# Patient Record
Sex: Female | Born: 1970 | Race: White | Hispanic: No | Marital: Single | Smoking: Never smoker
Health system: Southern US, Community
[De-identification: ages and names within clinical notes are randomized; demographics above are authoritative.]

## PROBLEM LIST (undated history)

## (undated) DIAGNOSIS — Z789 Other specified health status: Secondary | ICD-10-CM

## (undated) HISTORY — PX: WISDOM TOOTH EXTRACTION: SHX21

---

## 1997-09-06 ENCOUNTER — Ambulatory Visit (HOSPITAL_COMMUNITY): Admission: RE | Admit: 1997-09-06 | Discharge: 1997-09-06 | Payer: Self-pay | Admitting: *Deleted

## 1998-10-18 ENCOUNTER — Other Ambulatory Visit: Admission: RE | Admit: 1998-10-18 | Discharge: 1998-10-18 | Payer: Self-pay | Admitting: *Deleted

## 1999-08-27 ENCOUNTER — Inpatient Hospital Stay (HOSPITAL_COMMUNITY): Admission: AD | Admit: 1999-08-27 | Discharge: 1999-08-27 | Payer: Self-pay | Admitting: Obstetrics and Gynecology

## 1999-08-27 ENCOUNTER — Inpatient Hospital Stay (HOSPITAL_COMMUNITY): Admission: AD | Admit: 1999-08-27 | Discharge: 1999-08-30 | Payer: Self-pay | Admitting: Obstetrics and Gynecology

## 1999-10-02 ENCOUNTER — Other Ambulatory Visit: Admission: RE | Admit: 1999-10-02 | Discharge: 1999-10-02 | Payer: Self-pay | Admitting: Obstetrics and Gynecology

## 2000-11-25 ENCOUNTER — Other Ambulatory Visit: Admission: RE | Admit: 2000-11-25 | Discharge: 2000-11-25 | Payer: Self-pay | Admitting: Obstetrics and Gynecology

## 2001-10-20 ENCOUNTER — Other Ambulatory Visit: Admission: RE | Admit: 2001-10-20 | Discharge: 2001-10-20 | Payer: Self-pay | Admitting: Obstetrics and Gynecology

## 2002-05-08 ENCOUNTER — Inpatient Hospital Stay (HOSPITAL_COMMUNITY): Admission: AD | Admit: 2002-05-08 | Discharge: 2002-05-10 | Payer: Self-pay | Admitting: Obstetrics and Gynecology

## 2002-06-11 ENCOUNTER — Other Ambulatory Visit: Admission: RE | Admit: 2002-06-11 | Discharge: 2002-06-11 | Payer: Self-pay | Admitting: Obstetrics and Gynecology

## 2003-08-09 ENCOUNTER — Other Ambulatory Visit: Admission: RE | Admit: 2003-08-09 | Discharge: 2003-08-09 | Payer: Self-pay | Admitting: Obstetrics and Gynecology

## 2004-10-31 ENCOUNTER — Other Ambulatory Visit: Admission: RE | Admit: 2004-10-31 | Discharge: 2004-10-31 | Payer: Self-pay | Admitting: Obstetrics and Gynecology

## 2013-11-30 LAB — OB RESULTS CONSOLE RPR: RPR: NONREACTIVE

## 2013-11-30 LAB — OB RESULTS CONSOLE RUBELLA ANTIBODY, IGM: Rubella: NON-IMMUNE/NOT IMMUNE

## 2013-11-30 LAB — OB RESULTS CONSOLE HEPATITIS B SURFACE ANTIGEN: Hepatitis B Surface Ag: NEGATIVE

## 2013-11-30 LAB — OB RESULTS CONSOLE HIV ANTIBODY (ROUTINE TESTING): HIV: NONREACTIVE

## 2013-12-08 LAB — OB RESULTS CONSOLE GC/CHLAMYDIA
Chlamydia: NEGATIVE
GC PROBE AMP, GENITAL: NEGATIVE

## 2014-02-12 NOTE — L&D Delivery Note (Signed)
Delivery Note  SVD viable female Apgars 8 over 2nd degree ML lac.  Placenta delivered spontaneously intact with 3VC. Repair with 2-0 Chromic with good support and hemostasis noted and R/V exam confirms.  PH art was not done.  [place] cord blood was not done.  Mother and baby were doing well.  EBL 150cc  Candice Campavid Victor Granados, MD

## 2014-06-07 LAB — OB RESULTS CONSOLE GBS: GBS: POSITIVE

## 2014-06-29 ENCOUNTER — Inpatient Hospital Stay (HOSPITAL_COMMUNITY)
Admission: AD | Admit: 2014-06-29 | Discharge: 2014-07-02 | DRG: 775 | Disposition: A | Discharge status: Home / Self Care | Payer: BLUE CROSS/BLUE SHIELD | Source: Ambulatory Visit | Attending: Obstetrics and Gynecology | Admitting: Obstetrics and Gynecology

## 2014-06-29 ENCOUNTER — Encounter (HOSPITAL_COMMUNITY): Payer: Self-pay | Admitting: *Deleted

## 2014-06-29 DIAGNOSIS — O99820 Streptococcus B carrier state complicating pregnancy: Secondary | ICD-10-CM | POA: Diagnosis present

## 2014-06-29 DIAGNOSIS — Z3A39 39 weeks gestation of pregnancy: Secondary | ICD-10-CM | POA: Diagnosis present

## 2014-06-29 DIAGNOSIS — D696 Thrombocytopenia, unspecified: Secondary | ICD-10-CM | POA: Diagnosis present

## 2014-06-29 DIAGNOSIS — O9912 Other diseases of the blood and blood-forming organs and certain disorders involving the immune mechanism complicating childbirth: Principal | ICD-10-CM | POA: Diagnosis present

## 2014-06-29 DIAGNOSIS — O09523 Supervision of elderly multigravida, third trimester: Secondary | ICD-10-CM | POA: Diagnosis present

## 2014-06-29 HISTORY — DX: Other specified health status: Z78.9

## 2014-06-29 LAB — TYPE AND SCREEN
ABO/RH(D): A POS
ANTIBODY SCREEN: NEGATIVE

## 2014-06-29 LAB — CBC
HEMATOCRIT: 33.9 % — AB (ref 36.0–46.0)
Hemoglobin: 11.9 g/dL — ABNORMAL LOW (ref 12.0–15.0)
MCH: 30.2 pg (ref 26.0–34.0)
MCHC: 35.1 g/dL (ref 30.0–36.0)
MCV: 86 fL (ref 78.0–100.0)
PLATELETS: 92 10*3/uL — AB (ref 150–400)
RBC: 3.94 MIL/uL (ref 3.87–5.11)
RDW: 14 % (ref 11.5–15.5)
WBC: 11.1 10*3/uL — ABNORMAL HIGH (ref 4.0–10.5)

## 2014-06-29 LAB — AMNISURE RUPTURE OF MEMBRANE (ROM) NOT AT ARMC: AMNISURE: POSITIVE

## 2014-06-29 MED ORDER — PENICILLIN G POTASSIUM 5000000 UNITS IJ SOLR
2.5000 10*6.[IU] | INTRAMUSCULAR | Status: DC
  Filled 2014-06-29 (×4): qty 2.5

## 2014-06-29 MED ORDER — PENICILLIN G POTASSIUM 5000000 UNITS IJ SOLR
5.0000 10*6.[IU] | Freq: Once | INTRAVENOUS | Status: AC
  Administered 2014-06-29: 5 10*6.[IU] via INTRAVENOUS
  Filled 2014-06-29: qty 5

## 2014-06-29 MED ORDER — BUTORPHANOL TARTRATE 1 MG/ML IJ SOLN: 1.0000 mg | Freq: Once | INTRAMUSCULAR | Status: DC

## 2014-06-29 MED ORDER — LACTATED RINGERS IV SOLN
INTRAVENOUS | Status: DC
  Administered 2014-06-29: 23:00:00 via INTRAVENOUS

## 2014-06-29 MED ORDER — BUTORPHANOL TARTRATE 1 MG/ML IJ SOLN
INTRAMUSCULAR | Status: AC
  Administered 2014-06-29: 1 mg
  Filled 2014-06-29: qty 1

## 2014-06-29 NOTE — MAU Note (Signed)
Pt reports ?leaking fluid since 2030, contractions.

## 2014-06-29 NOTE — Progress Notes (Signed)
Dr Rana SnareLowe notified of pt's arrival and complaints, + amnisure

## 2014-06-29 NOTE — MAU Provider Note (Signed)
Colleen Shaffer is a 44 y.o. [id number] at 291w3d  who presents to MAU today complaining of contractions and LOF. The patient states LOF since 2030 today. She states blood tinge in mucus, but denies heavy vaginal bleeding. She states irregular contractions. She denies complications with the pregnancy.    BP 134/78 mmHg  Pulse 73  Temp(Src) 98.2 F (36.8 C) (Oral)  Resp 16  Ht 5\' 4"  (1.626 m)  Wt 151 lb (68.493 kg)  BMI 25.91 kg/m2  SpO2 100% GENERAL: Well-developed, well-nourished female in no acute distress.  HEAD: Normocephalic, atraumatic.  CHEST: Normal effort of breathing, regular heart rate ABDOMEN: Soft, nontender, nondistended.  PELVIC: Normal external female genitalia. Vagina is pink and rugated.  Normal discharge.  Positive pooling. Gravid uterus.   EXTREMITIES: No cyanosis, clubbing, or edema Dilation: 2.5 Effacement (%): 90 Cervical Position: Middle Station: -3 Presentation: Vertex Exam by:: ginger morris rn  Fern - Negative  Fetal Monitoring:  Baseline: 130 bpm, moderate variability, + accelerations, no decelerations Contractions: q 3-5 minutes  MDM Exam showed pooling, but fern negative. Amnisure ordered.   Results for orders placed or performed during the hospital encounter of 06/29/14 (from the past 24 hour(s))  Amnisure rupture of membrane (rom)     Status: None   Collection Time: 06/29/14 10:20 PM  Result Value Ref Range   Amnisure ROM POSITIVE    A: SIUP at 421w3d SROM  P: Report given to RN to contact MD on call for further instructions  Marny LowensteinJulie N Eythan Jayne, PA-C 06/29/2014 10:37 PM

## 2014-06-30 ENCOUNTER — Encounter (HOSPITAL_COMMUNITY): Payer: Self-pay

## 2014-06-30 DIAGNOSIS — O9912 Other diseases of the blood and blood-forming organs and certain disorders involving the immune mechanism complicating childbirth: Secondary | ICD-10-CM | POA: Diagnosis present

## 2014-06-30 DIAGNOSIS — O09523 Supervision of elderly multigravida, third trimester: Secondary | ICD-10-CM | POA: Diagnosis present

## 2014-06-30 DIAGNOSIS — D696 Thrombocytopenia, unspecified: Secondary | ICD-10-CM | POA: Diagnosis present

## 2014-06-30 DIAGNOSIS — O99820 Streptococcus B carrier state complicating pregnancy: Secondary | ICD-10-CM | POA: Diagnosis present

## 2014-06-30 DIAGNOSIS — Z3A39 39 weeks gestation of pregnancy: Secondary | ICD-10-CM | POA: Diagnosis present

## 2014-06-30 LAB — CBC
HEMATOCRIT: 32.8 % — AB (ref 36.0–46.0)
HEMOGLOBIN: 11.2 g/dL — AB (ref 12.0–15.0)
MCH: 29.8 pg (ref 26.0–34.0)
MCHC: 34.1 g/dL (ref 30.0–36.0)
MCV: 87.2 fL (ref 78.0–100.0)
Platelets: 86 10*3/uL — ABNORMAL LOW (ref 150–400)
RBC: 3.76 MIL/uL — ABNORMAL LOW (ref 3.87–5.11)
RDW: 14 % (ref 11.5–15.5)
WBC: 18.3 10*3/uL — ABNORMAL HIGH (ref 4.0–10.5)

## 2014-06-30 LAB — RPR: RPR Ser Ql: NONREACTIVE

## 2014-06-30 LAB — ABO/RH: ABO/RH(D): A POS

## 2014-06-30 MED ORDER — ACETAMINOPHEN 325 MG PO TABS
650.0000 mg | ORAL_TABLET | ORAL | Status: DC | PRN
  Filled 2014-06-30: qty 2

## 2014-06-30 MED ORDER — WITCH HAZEL-GLYCERIN EX PADS: 1.0000 "application " | MEDICATED_PAD | CUTANEOUS | Status: DC | PRN

## 2014-06-30 MED ORDER — ONDANSETRON HCL 4 MG PO TABS: 4.0000 mg | ORAL_TABLET | ORAL | Status: DC | PRN

## 2014-06-30 MED ORDER — IBUPROFEN 600 MG PO TABS
600.0000 mg | ORAL_TABLET | Freq: Four times a day (QID) | ORAL | Status: DC
Start: 2014-06-30 — End: 2014-06-30

## 2014-06-30 MED ORDER — DIPHENHYDRAMINE HCL 50 MG/ML IJ SOLN: 12.5000 mg | INTRAMUSCULAR | Status: DC | PRN

## 2014-06-30 MED ORDER — DIPHENHYDRAMINE HCL 25 MG PO CAPS: 25.0000 mg | ORAL_CAPSULE | Freq: Four times a day (QID) | ORAL | Status: DC | PRN

## 2014-06-30 MED ORDER — ACETAMINOPHEN 325 MG PO TABS
650.0000 mg | ORAL_TABLET | ORAL | Status: DC | PRN
  Administered 2014-06-30 – 2014-07-01 (×3): 650 mg via ORAL
  Filled 2014-06-30 (×2): qty 2

## 2014-06-30 MED ORDER — EPHEDRINE 5 MG/ML INJ
10.0000 mg | INTRAVENOUS | Status: DC | PRN
  Filled 2014-06-30: qty 2

## 2014-06-30 MED ORDER — TETANUS-DIPHTH-ACELL PERTUSSIS 5-2.5-18.5 LF-MCG/0.5 IM SUSP: 0.5000 mL | Freq: Once | INTRAMUSCULAR | Status: DC

## 2014-06-30 MED ORDER — OXYTOCIN BOLUS FROM INFUSION
500.0000 mL | INTRAVENOUS | Status: DC
  Administered 2014-06-30: 500 mL via INTRAVENOUS

## 2014-06-30 MED ORDER — SENNOSIDES-DOCUSATE SODIUM 8.6-50 MG PO TABS
2.0000 | ORAL_TABLET | ORAL | Status: DC
  Administered 2014-07-01 (×2): 2 via ORAL
  Filled 2014-06-30 (×2): qty 2

## 2014-06-30 MED ORDER — OXYCODONE-ACETAMINOPHEN 5-325 MG PO TABS: 2.0000 | ORAL_TABLET | ORAL | Status: DC | PRN

## 2014-06-30 MED ORDER — DIBUCAINE 1 % RE OINT: 1.0000 "application " | TOPICAL_OINTMENT | RECTAL | Status: DC | PRN

## 2014-06-30 MED ORDER — ONDANSETRON HCL 4 MG/2ML IJ SOLN: 4.0000 mg | Freq: Four times a day (QID) | INTRAMUSCULAR | Status: DC | PRN

## 2014-06-30 MED ORDER — SIMETHICONE 80 MG PO CHEW: 80.0000 mg | CHEWABLE_TABLET | ORAL | Status: DC | PRN

## 2014-06-30 MED ORDER — OXYCODONE-ACETAMINOPHEN 5-325 MG PO TABS
1.0000 | ORAL_TABLET | ORAL | Status: DC | PRN
Start: 2014-06-30 — End: 2014-06-30

## 2014-06-30 MED ORDER — LACTATED RINGERS IV SOLN: 500.0000 mL | INTRAVENOUS | Status: DC | PRN

## 2014-06-30 MED ORDER — PHENYLEPHRINE 40 MCG/ML (10ML) SYRINGE FOR IV PUSH (FOR BLOOD PRESSURE SUPPORT)
80.0000 ug | PREFILLED_SYRINGE | INTRAVENOUS | Status: DC | PRN
  Filled 2014-06-30: qty 2
  Filled 2014-06-30: qty 20

## 2014-06-30 MED ORDER — OXYCODONE-ACETAMINOPHEN 5-325 MG PO TABS
2.0000 | ORAL_TABLET | ORAL | Status: DC | PRN
Start: 2014-06-30 — End: 2014-06-30

## 2014-06-30 MED ORDER — OXYTOCIN 40 UNITS IN LACTATED RINGERS INFUSION - SIMPLE MED
62.5000 mL/h | INTRAVENOUS | Status: DC
  Filled 2014-06-30: qty 1000

## 2014-06-30 MED ORDER — BENZOCAINE-MENTHOL 20-0.5 % EX AERO
1.0000 "application " | INHALATION_SPRAY | CUTANEOUS | Status: DC | PRN
  Administered 2014-06-30: 1 via TOPICAL
  Filled 2014-06-30: qty 56

## 2014-06-30 MED ORDER — MEASLES, MUMPS & RUBELLA VAC ~~LOC~~ INJ
0.5000 mL | INJECTION | Freq: Once | SUBCUTANEOUS | Status: DC
  Filled 2014-06-30: qty 0.5

## 2014-06-30 MED ORDER — PRENATAL MULTIVITAMIN CH
1.0000 | ORAL_TABLET | Freq: Every day | ORAL | Status: DC
  Administered 2014-06-30 – 2014-07-01 (×2): 1 via ORAL
  Filled 2014-06-30 (×2): qty 1

## 2014-06-30 MED ORDER — CITRIC ACID-SODIUM CITRATE 334-500 MG/5ML PO SOLN: 30.0000 mL | ORAL | Status: DC | PRN

## 2014-06-30 MED ORDER — LIDOCAINE HCL (PF) 1 % IJ SOLN
30.0000 mL | INTRAMUSCULAR | Status: DC | PRN
  Administered 2014-06-30: 30 mL via SUBCUTANEOUS
  Filled 2014-06-30: qty 30

## 2014-06-30 MED ORDER — FENTANYL 2.5 MCG/ML BUPIVACAINE 1/10 % EPIDURAL INFUSION (WH - ANES)
14.0000 mL/h | INTRAMUSCULAR | Status: DC | PRN
  Filled 2014-06-30: qty 125

## 2014-06-30 MED ORDER — ONDANSETRON HCL 4 MG/2ML IJ SOLN: 4.0000 mg | INTRAMUSCULAR | Status: DC | PRN

## 2014-06-30 MED ORDER — MEDROXYPROGESTERONE ACETATE 150 MG/ML IM SUSP: 150.0000 mg | INTRAMUSCULAR | Status: DC | PRN

## 2014-06-30 MED ORDER — LANOLIN HYDROUS EX OINT: TOPICAL_OINTMENT | CUTANEOUS | Status: DC | PRN

## 2014-06-30 MED ORDER — ZOLPIDEM TARTRATE 5 MG PO TABS: 5.0000 mg | ORAL_TABLET | Freq: Every evening | ORAL | Status: DC | PRN

## 2014-06-30 MED ORDER — OXYCODONE-ACETAMINOPHEN 5-325 MG PO TABS: 1.0000 | ORAL_TABLET | ORAL | Status: DC | PRN

## 2014-06-30 NOTE — Progress Notes (Signed)
Post Partum Day 0 Subjective: no complaints, up ad lib, voiding and tolerating PO  Objective: Blood pressure 125/62, pulse 63, temperature 98.4 F (36.9 C), temperature source Oral, resp. rate 18, height 5\' 4"  (1.626 m), weight 151 lb (68.493 kg), SpO2 100 %, unknown if currently breastfeeding.  Physical Exam:  General: alert and cooperative Lochia: appropriate Uterine Fundus: firm Incision: healing well DVT Evaluation: No evidence of DVT seen on physical exam. Negative Homan's sign. No cords or calf tenderness. No significant calf/ankle edema.   Recent Labs  06/29/14 2250 06/30/14 0548  HGB 11.9* 11.2*  HCT 33.9* 32.8*    Assessment/Plan: Plan for discharge tomorrow  Repeat CBC in am , hold Motrin, secondary to low platelets   LOS: 0 days   Jermone Geister G 06/30/2014, 8:16 AM

## 2014-06-30 NOTE — H&P (Signed)
Colleen Shaffer is a 44 y.o. female presenting for SROM and labor sxs.  Pregnancy complicated by AMA with normal NIPT and GBS+. History OB History    Gravida Para Term Preterm AB TAB SAB Ectopic Multiple Living   4 2 2  1  1   2      No past medical history on file. No past surgical history on file. Family History: family history is not on file. Social History:  has no tobacco, alcohol, and drug history on file.   Prenatal Transfer Tool  Maternal Diabetes: No Genetic Screening: Normal Maternal Ultrasounds/Referrals: Normal Fetal Ultrasounds or other Referrals:  None Maternal Substance Abuse:  No Significant Maternal Medications:  None Significant Maternal Lab Results:  None Other Comments:  None  ROS  Dilation: 5 Effacement (%): 100 Station: -2 Exam by:: Avery DennisonBenji Stanley RN Blood pressure 134/78, pulse 73, temperature 97.9 F (36.6 Shaffer), temperature source Oral, resp. rate 16, height 5\' 4"  (1.626 m), weight 151 lb (68.493 kg), SpO2 100 %. Exam Physical Exam  Prenatal labs: ABO, Rh: --/--/A POS (05/17 2250) Antibody: NEG (05/17 2250) Rubella:   RPR:    HBsAg:    HIV:    GBS:     Assessment/Plan: IUP at term with SROM Active labor GBS + will start IV abx Anticipate SVD   Colleen Shaffer 06/30/2014, 12:05 AM

## 2014-06-30 NOTE — Lactation Note (Signed)
This note was copied from the chart of Colleen Shaffer. Lactation Consultation Note: Experienced BF mom- reports that baby just finished nursing for 20 min. Reports she was able to latch baby by herself this feeding-  has been needing assistance. Last LS 9 by RN.  Reviewed feeding cues and encouraged to feed whenever she sees them. BF brochure given with resources for support after DC  No questions at present. To call for assist prn.  Patient Name: Colleen Coralie CommonMichelle Lamoureaux Today's Date: 06/30/2014 Reason for consult: Initial assessment   Maternal Data Formula Feeding for Exclusion: No Does the patient have breastfeeding experience prior to this delivery?: Yes  Feeding        Lactation Tools Discussed/Used     Consult Status Consult Status: Follow-up Date: 07/01/14 Follow-up type: In-patient    Pamelia HoitWeeks, Payge Eppes D 06/30/2014, 1:31 PM

## 2014-07-01 ENCOUNTER — Encounter (HOSPITAL_COMMUNITY): Payer: Self-pay | Admitting: *Deleted

## 2014-07-01 LAB — CBC
HEMATOCRIT: 30.5 % — AB (ref 36.0–46.0)
Hemoglobin: 10.3 g/dL — ABNORMAL LOW (ref 12.0–15.0)
MCH: 29.8 pg (ref 26.0–34.0)
MCHC: 33.8 g/dL (ref 30.0–36.0)
MCV: 88.2 fL (ref 78.0–100.0)
Platelets: 83 10*3/uL — ABNORMAL LOW (ref 150–400)
RBC: 3.46 MIL/uL — ABNORMAL LOW (ref 3.87–5.11)
RDW: 14.3 % (ref 11.5–15.5)
WBC: 11.4 10*3/uL — ABNORMAL HIGH (ref 4.0–10.5)

## 2014-07-01 MED ORDER — MEASLES, MUMPS & RUBELLA VAC ~~LOC~~ INJ
0.5000 mL | INJECTION | Freq: Once | SUBCUTANEOUS | Status: AC
  Administered 2014-07-02: 0.5 mL via SUBCUTANEOUS
  Filled 2014-07-01 (×2): qty 0.5

## 2014-07-01 NOTE — Progress Notes (Signed)
Post Partum Day 1 Subjective: no complaints, up ad lib, voiding, tolerating PO and + flatus  Objective: Blood pressure 111/65, pulse 71, temperature 98.6 F (37 C), temperature source Oral, resp. rate 19, height 5\' 4"  (1.626 m), weight 151 lb (68.493 kg), SpO2 99 %, unknown if currently breastfeeding.  Physical Exam:  General: alert and cooperative Lochia: appropriate Uterine Fundus: firm Incision: healing well DVT Evaluation: No evidence of DVT seen on physical exam. Negative Homan's sign. No cords or calf tenderness. No significant calf/ankle edema.   Recent Labs  06/30/14 0548 07/01/14 0529  HGB 11.2* 10.3*  HCT 32.8* 30.5*    Assessment/Plan: Plan for discharge tomorrow  Patient was + for GBBS, plan for discharge in am   LOS: 1 day   CURTIS,CAROL G 07/01/2014, 8:10 AM

## 2014-07-02 MED ORDER — HYDROMORPHONE HCL 2 MG PO TABS: 2.0000 mg | ORAL_TABLET | Freq: Four times a day (QID) | ORAL | Status: AC | PRN

## 2014-07-02 NOTE — Lactation Note (Signed)
This note was copied from the chart of Colleen Coralie CommonMichelle Wilmer. Lactation Consultation Note: Baby at 9% weight loss. Has supplemented once through the night with curved tip syringe, Also has foley cup and feeding tube/syringe to supplement until mature milk comes in. Mom states she feels comfortable with curved tip syringe and cup. Does not feel any fuller this morning. Assisted with latch- mom needs some help with untucking bottom lip- reports it feels fine after that. No questions at present. Reviewed BFS[facility] and OP appointments as resources for support after DC. To call prn  Patient Name: Colleen Shaffer RUEAV'WToday's Date: 07/02/2014 Reason for consult: Follow-up assessment   Maternal Data Formula Feeding for Exclusion: No Has patient been taught Hand Expression?: Yes Does the patient have breastfeeding experience prior to this delivery?: Yes  Feeding Feeding Type: Breast Fed Length of feed: 25 min  LATCH Score/Interventions Latch: Grasps breast easily, tongue down, lips flanged, rhythmical sucking.  Audible Swallowing: A few with stimulation  Type of Nipple: Everted at rest and after stimulation  Comfort (Breast/Nipple): Filling, red/small blisters or bruises, mild/mod discomfort  Problem noted: Mild/Moderate discomfort Interventions (Mild/moderate discomfort): Comfort gels;Hand expression  Hold (Positioning): Assistance needed to correctly position infant at breast and maintain latch. Intervention(s): Breastfeeding basics reviewed  LATCH Score: 7  Lactation Tools Discussed/Used WIC Program: No   Consult Status Consult Status: Complete    Pamelia HoitWeeks, Cing Wallace D 07/02/2014, 9:06 AM

## 2014-07-02 NOTE — Progress Notes (Signed)
Post Partum Day 2 Subjective: no complaints, up ad lib, voiding, tolerating PO, + flatus and no nose bleeds or gum bleeds  Objective: Blood pressure 117/69, pulse 66, temperature 98.1 F (36.7 C), temperature source Oral, resp. rate 16, height 5\' 4"  (1.626 m), weight 151 lb (68.493 kg), SpO2 99 %, unknown if currently breastfeeding.  Physical Exam:  General: alert, cooperative and no distress Lochia: appropriate Uterine Fundus: firm Incision: healing well DVT Evaluation: No evidence of DVT seen on physical exam.   Recent Labs  06/30/14 0548 07/01/14 0529  HGB 11.2* 10.3*  HCT 32.8* 30.5*    Assessment/Plan: Discharge home   LOS: 2 days   Alvis Edgell II,Dwyane Dupree E 07/02/2014, 7:47 AM

## 2014-07-02 NOTE — Discharge Instructions (Signed)
No ibuprofen, Advil, Motrin, Alleve, aspirin No vaginal entry

## 2014-07-02 NOTE — Discharge Summary (Signed)
Obstetric Discharge Summary Reason for Admission: onset of labor Prenatal Procedures: none Intrapartum Procedures: spontaneous vaginal delivery Postpartum Procedures: none Complications-Operative and Postpartum: none HEMOGLOBIN  Date Value Ref Range Status  07/01/2014 10.3* 12.0 - 15.0 g/dL Final   HCT  Date Value Ref Range Status  07/01/2014 30.5* 36.0 - 46.0 % Final    Physical Exam:  General: alert, cooperative and no distress Lochia: appropriate Uterine Fundus: firm Incision: healing well DVT Evaluation: No evidence of DVT seen on physical exam.  Discharge Diagnoses: Term Pregnancy-delivered  Discharge Information: Date: 07/02/2014 Activity: pelvic rest Diet: routine Medications: PNV and dilaudid Condition: stable Instructions: refer to practice specific booklet Discharge to: home Follow-up Information    Call in 3 days to follow up.      Newborn Data: Live born female  Birth Weight: 7 lb 12.9 oz (3541 g) APGAR: 8, 9  Home with mother.  Colleen Shaffer II,Colleen Shaffer 07/02/2014, 7:51 AM

## 2019-12-07 ENCOUNTER — Other Ambulatory Visit: Payer: Self-pay | Admitting: Obstetrics and Gynecology

## 2019-12-07 DIAGNOSIS — R928 Other abnormal and inconclusive findings on diagnostic imaging of breast: Secondary | ICD-10-CM

## 2019-12-23 ENCOUNTER — Other Ambulatory Visit: Payer: Self-pay | Admitting: Obstetrics and Gynecology

## 2019-12-23 ENCOUNTER — Ambulatory Visit
Admission: RE | Admit: 2019-12-23 | Discharge: 2019-12-23 | Disposition: A | Discharge status: Home / Self Care | Payer: BLUE CROSS/BLUE SHIELD | Source: Ambulatory Visit | Attending: Obstetrics and Gynecology | Admitting: Obstetrics and Gynecology

## 2019-12-23 ENCOUNTER — Other Ambulatory Visit: Payer: Self-pay

## 2019-12-23 DIAGNOSIS — R928 Other abnormal and inconclusive findings on diagnostic imaging of breast: Secondary | ICD-10-CM

## 2020-01-01 ENCOUNTER — Other Ambulatory Visit: Payer: Self-pay

## 2020-01-01 ENCOUNTER — Ambulatory Visit
Admission: RE | Admit: 2020-01-01 | Discharge: 2020-01-01 | Disposition: A | Discharge status: Home / Self Care | Payer: BC Managed Care – PPO | Source: Ambulatory Visit | Attending: Obstetrics and Gynecology | Admitting: Obstetrics and Gynecology

## 2020-01-01 ENCOUNTER — Other Ambulatory Visit: Payer: Self-pay | Admitting: Obstetrics and Gynecology

## 2020-01-01 ENCOUNTER — Ambulatory Visit
Admission: RE | Admit: 2020-01-01 | Discharge: 2020-01-01 | Disposition: A | Discharge status: Home / Self Care | Payer: BLUE CROSS/BLUE SHIELD | Source: Ambulatory Visit | Attending: Obstetrics and Gynecology | Admitting: Obstetrics and Gynecology

## 2020-01-01 DIAGNOSIS — R928 Other abnormal and inconclusive findings on diagnostic imaging of breast: Secondary | ICD-10-CM

## 2022-05-18 IMAGING — US US BREAST*L* LIMITED INC AXILLA
1 series · 13 of 19 positions shown · non-contrast
Comparison: Previous exam(s).

CLINICAL DATA: Screening recall for a possible left breast mass and
asymmetry.

EXAM:
DIGITAL DIAGNOSTIC LEFT MAMMOGRAM WITH CAD AND TOMO
ULTRASOUND LEFT BREAST

[Series 1: us breast*left* limited inc axilla · 0.07mm/px · 13 of 19 slices shown]
[im 1/19]
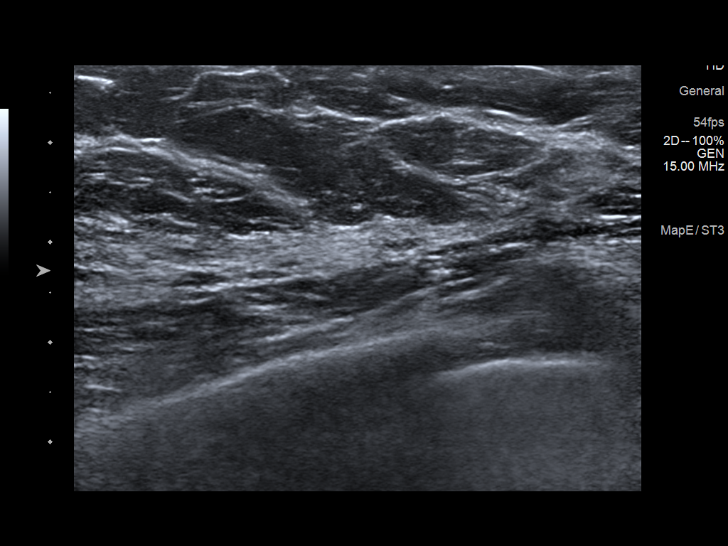
[im 3/19]
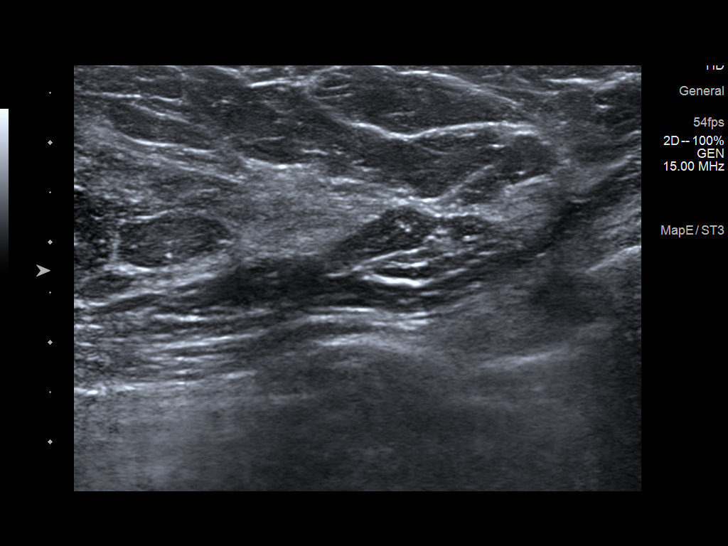
[im 4/19]
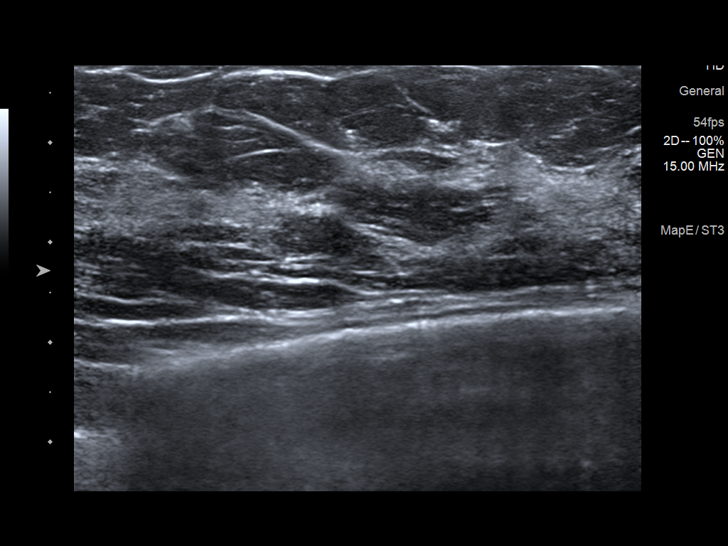
[im 6/19]
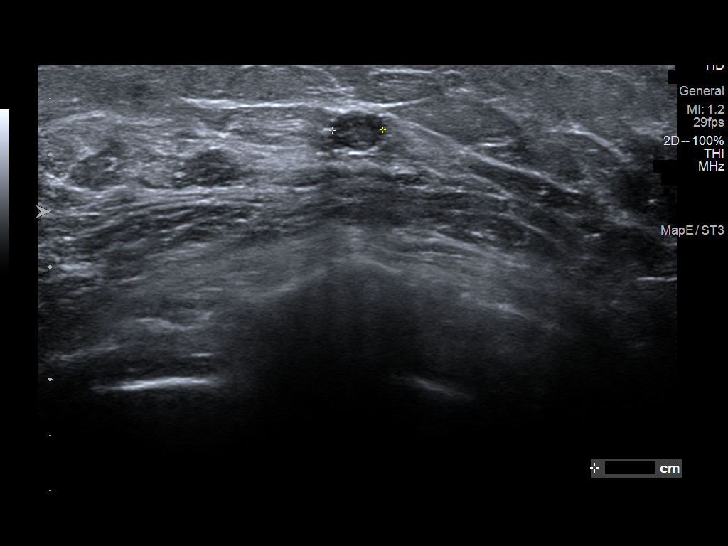
[im 7/19]
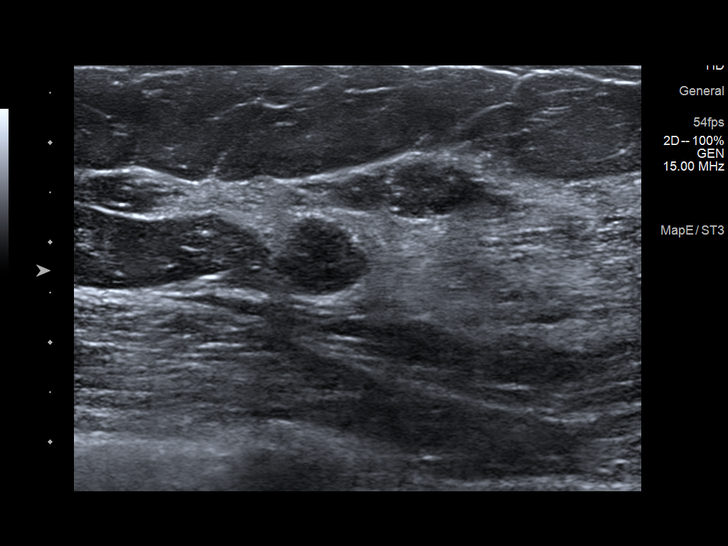
[im 9/19]
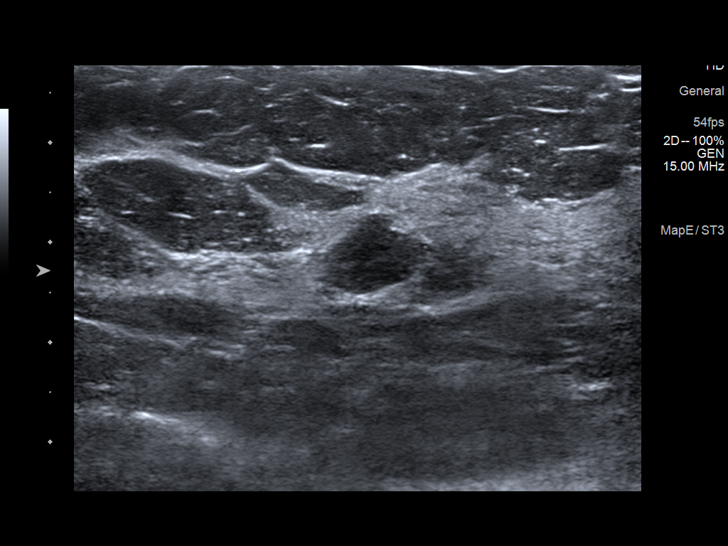
[im 10/19]
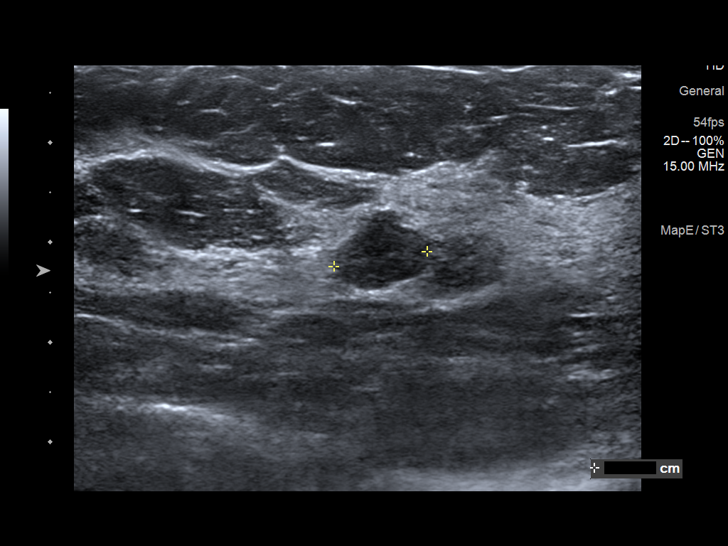
[im 11/19]
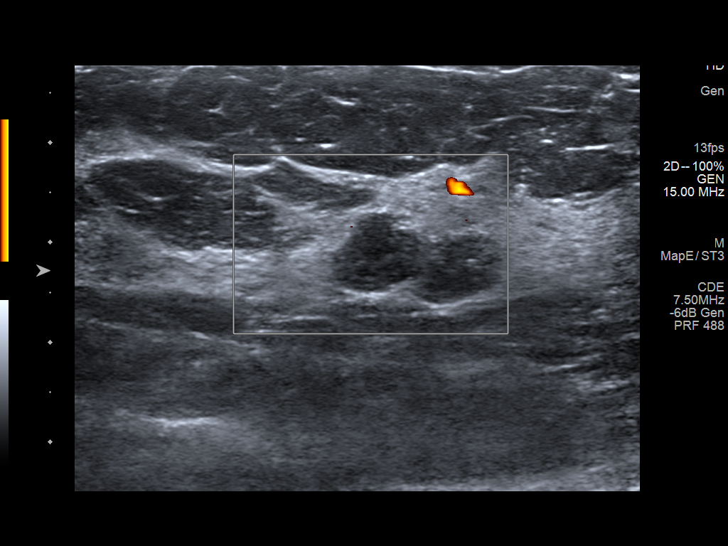
[im 13/19]
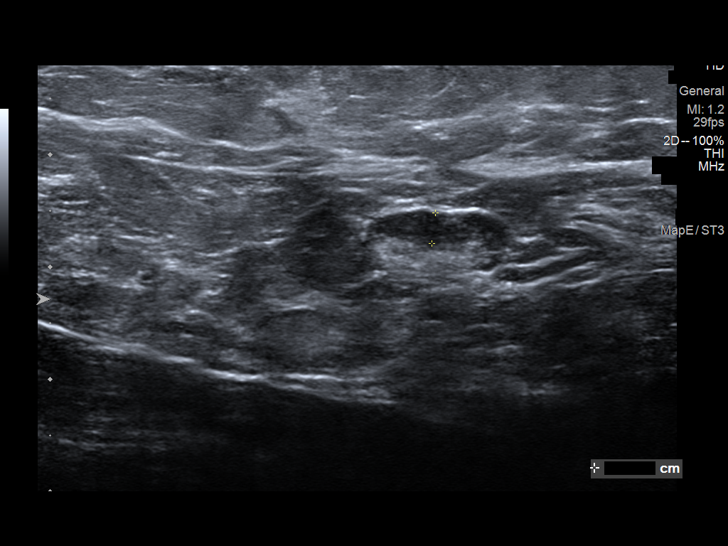
[im 14/19]
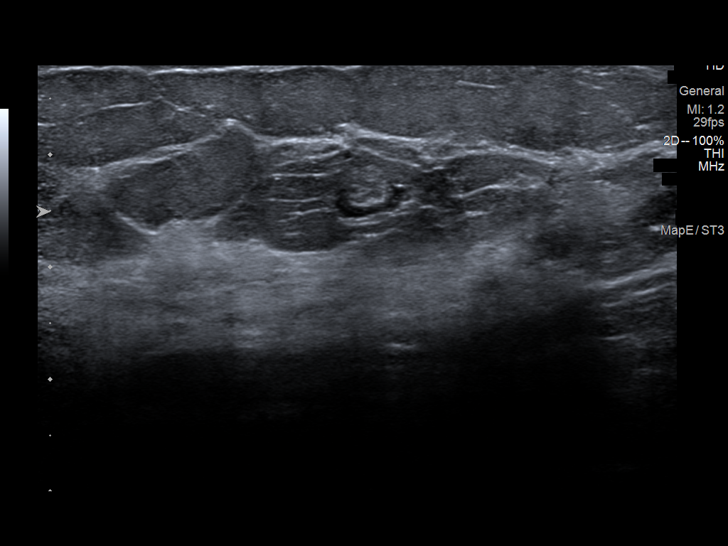
[im 16/19]
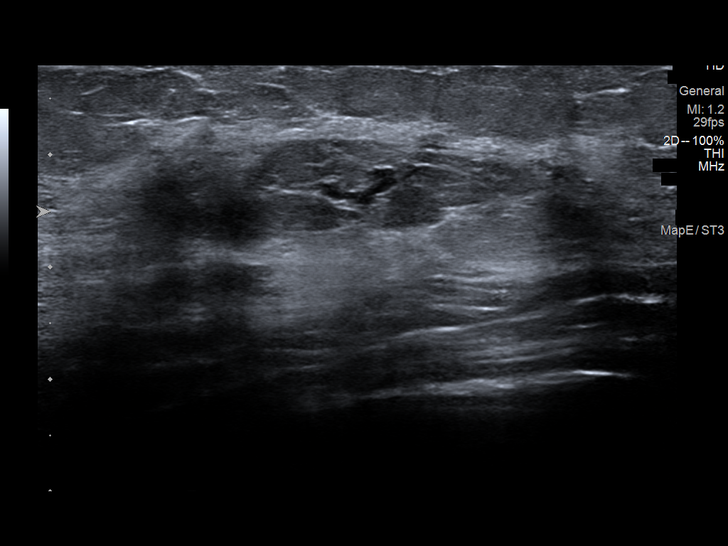
[im 17/19]
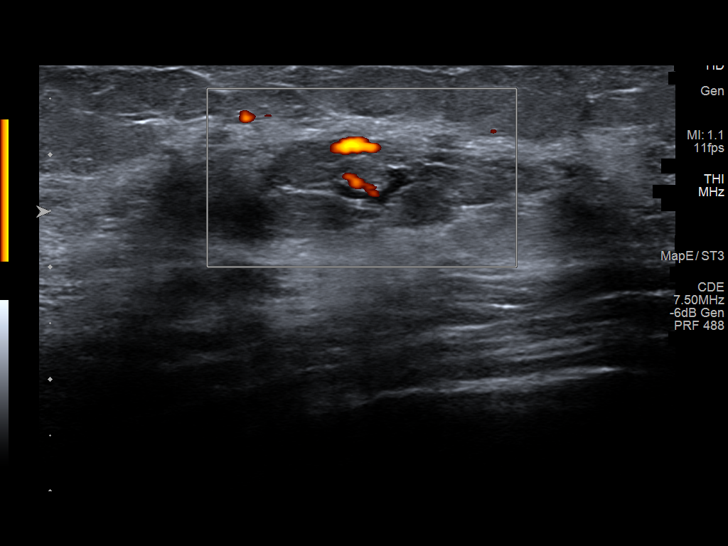
[im 19/19]
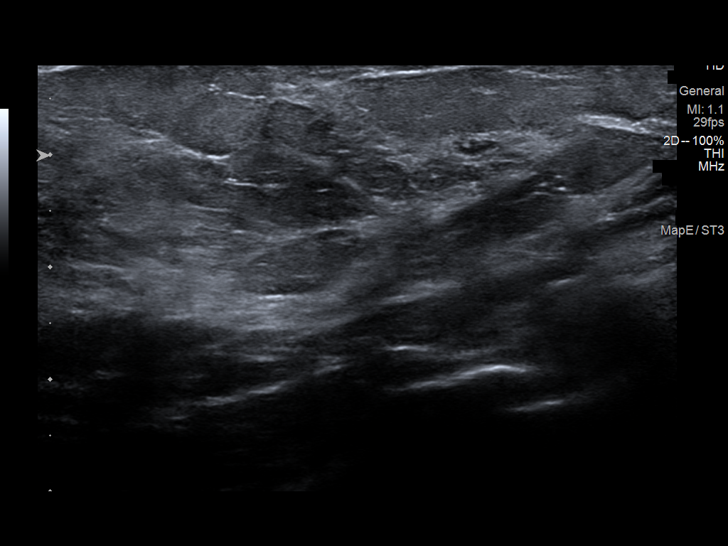

[13 of 19 positions shown; findings below may reference images not displayed]

ACR Breast Density Category c: The breast tissue is heterogeneously
dense, which may obscure small masses.
FINDINGS: In the upper posterior left breast the possible mass/asymmetry
disperses on the spot-compression MLO view. Anterior to this, the
possible small mass/asymmetry partly disperses on the
spot-compression MLO view. On the diagnostic tomosynthesis mL images
there are 2 apparent small masses, 1 in the more posterior upper
outer quadrant and the other in the lateral to lower outer quadrant,
both measuring approximately 5 mm in long axis. There are no areas
of architectural distortion and no suspicious calcifications.

Mammographic images were processed with CAD.

On physical exam, no mass is palpated in the upper left breast.

Targeted ultrasound is performed, showing a hypoechoic round mass in
the left breast at 12 o'clock, 4 cm from the nipple, posterior
depth, measuring 10 x 8 x 9 mm. Margins are partly circumscribed and
partly ill-defined. No convincing internal blood flow on color
Doppler analysis.

In the left breast at 2 o'clock, 3 cm the nipple, there is a normal
appearing lymph node measuring 7 mm in long axis, consistent with
the more posterior mass noted on the diagnostic tomosynthesis mL
images. There is a small benign cluster of cysts in the left breast
at 4 o'clock, 3 cm the nipple, consistent with the other small mass
seen on the diagnostic tomosynthesis mL images, which corresponds to
the more anterior and smaller of the screening detected
abnormalities.

Sonographic evaluation of the left axilla shows no enlarged or
abnormal lymph nodes.
IMPRESSION: 1. Indeterminate 1 cm mass in the left breast at 12 o'clock, 4 cm
from the nipple, posterior depth. Tissue sampling is recommended.
2. Benign/normal intramammary lymph node in the left breast at 2
o'clock, 3 cm the nipple.
3. Small benign cluster of cysts in the left breast at 4 o'clock, 3
cm the nipple.

RECOMMENDATION:
1. Ultrasound-guided core needle biopsy of the 1 cm mass in the left
breast at 12 o'clock. This procedure was scheduled prior to the
patient being discharged from the [REDACTED].

I have discussed the findings and recommendations with the patient.
If applicable, a reminder letter will be sent to the patient
regarding the next appointment.

BI-RADS CATEGORY  4: Suspicious.

## 2022-05-27 IMAGING — MG MM BREAST LOCALIZATION CLIP
4 series · 4 of 12 positions shown · non-contrast
Comparison: Previous exam(s).

CLINICAL DATA: Status post ultrasound-guided core biopsy of a left
breast mass.

EXAM:
3D DIAGNOSTIC LEFT MAMMOGRAM POST ULTRASOUND BIOPSY

[L CC synth-2D]
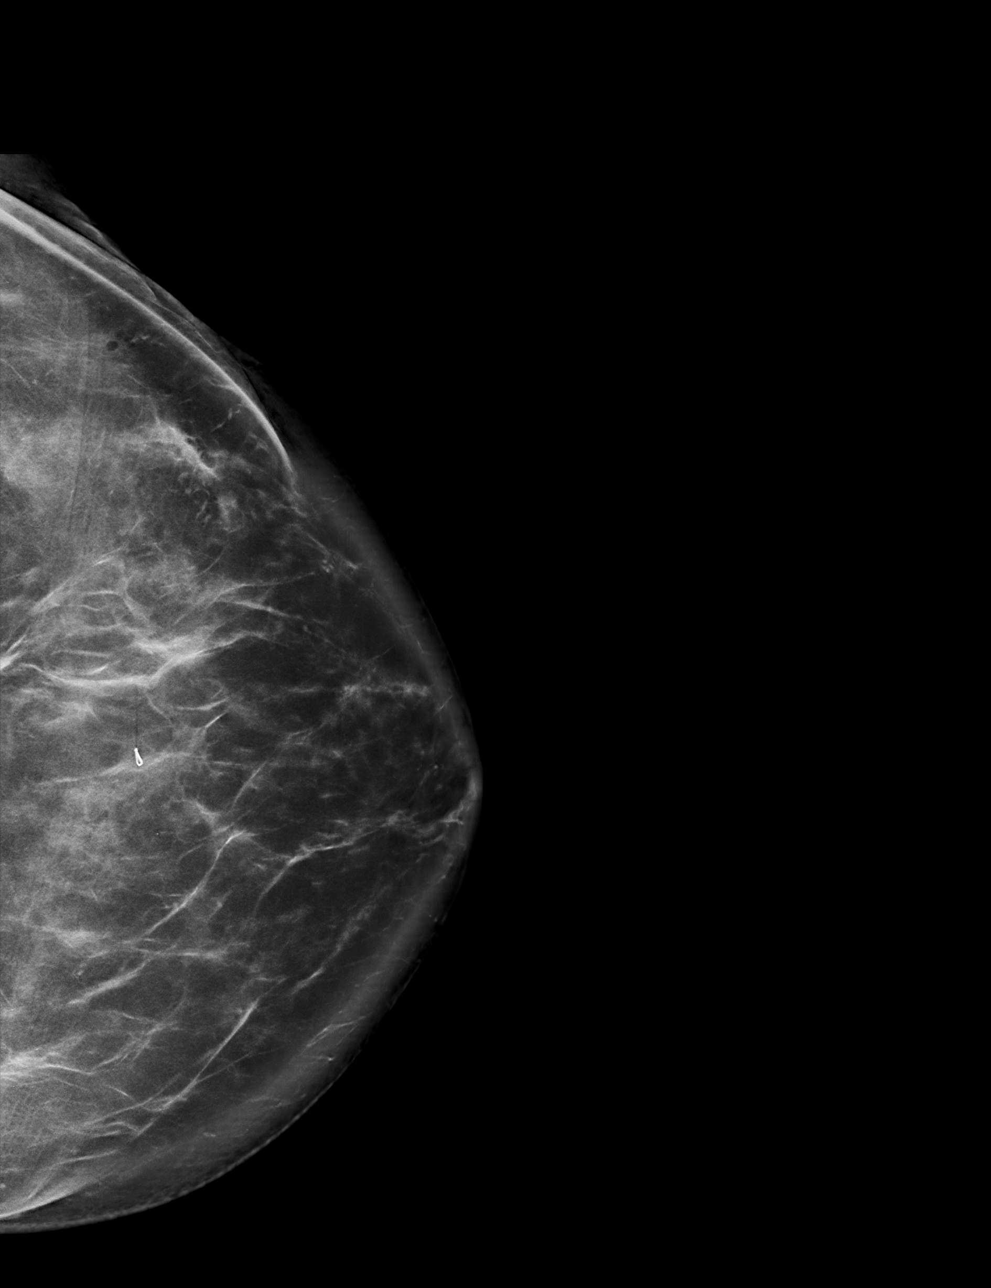

[L ML synth-2D]
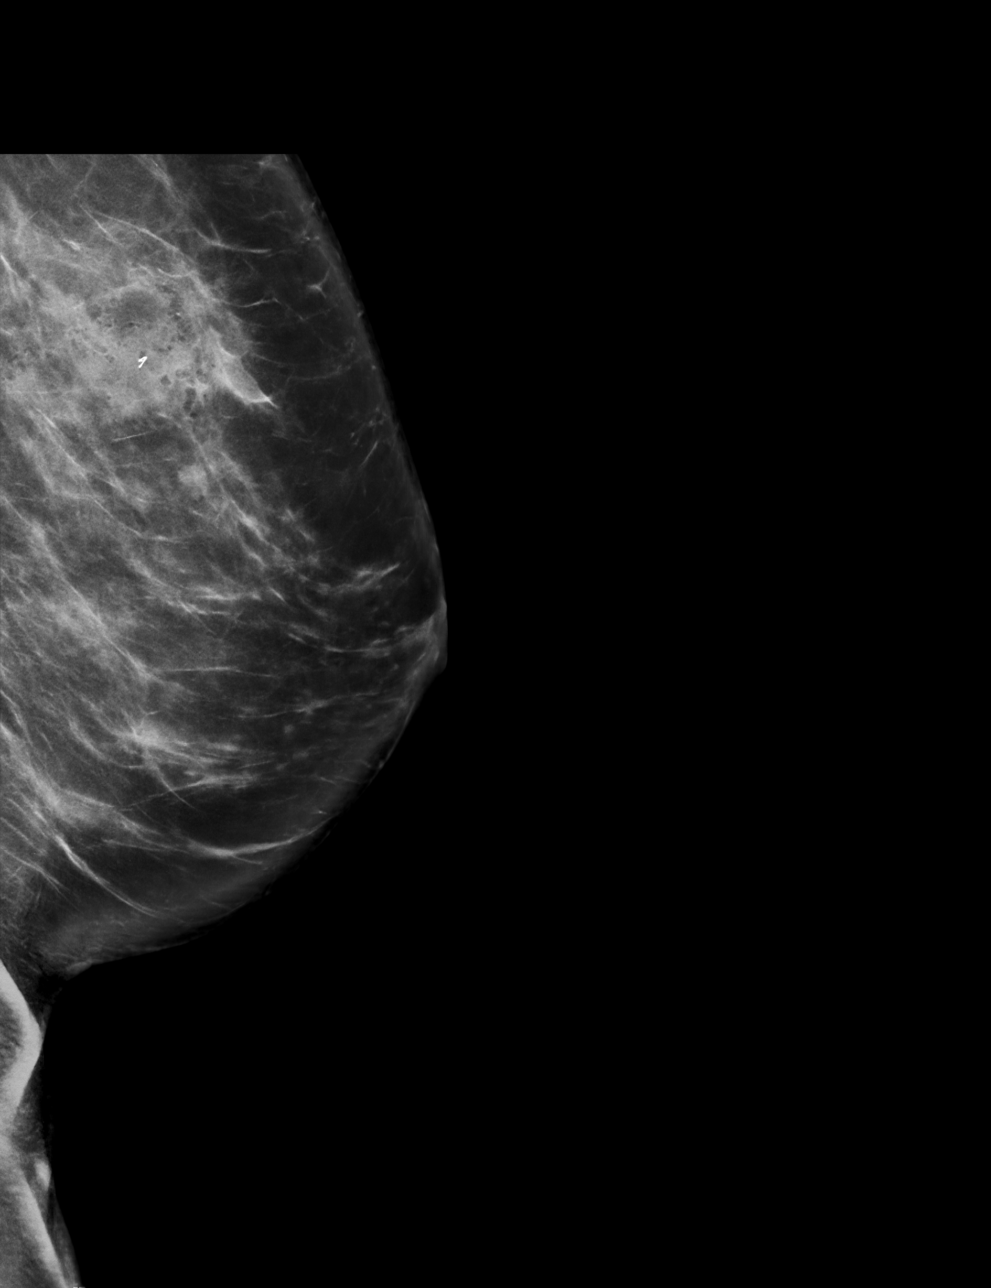

[L CC tomo · tomo slice 47/93.0]
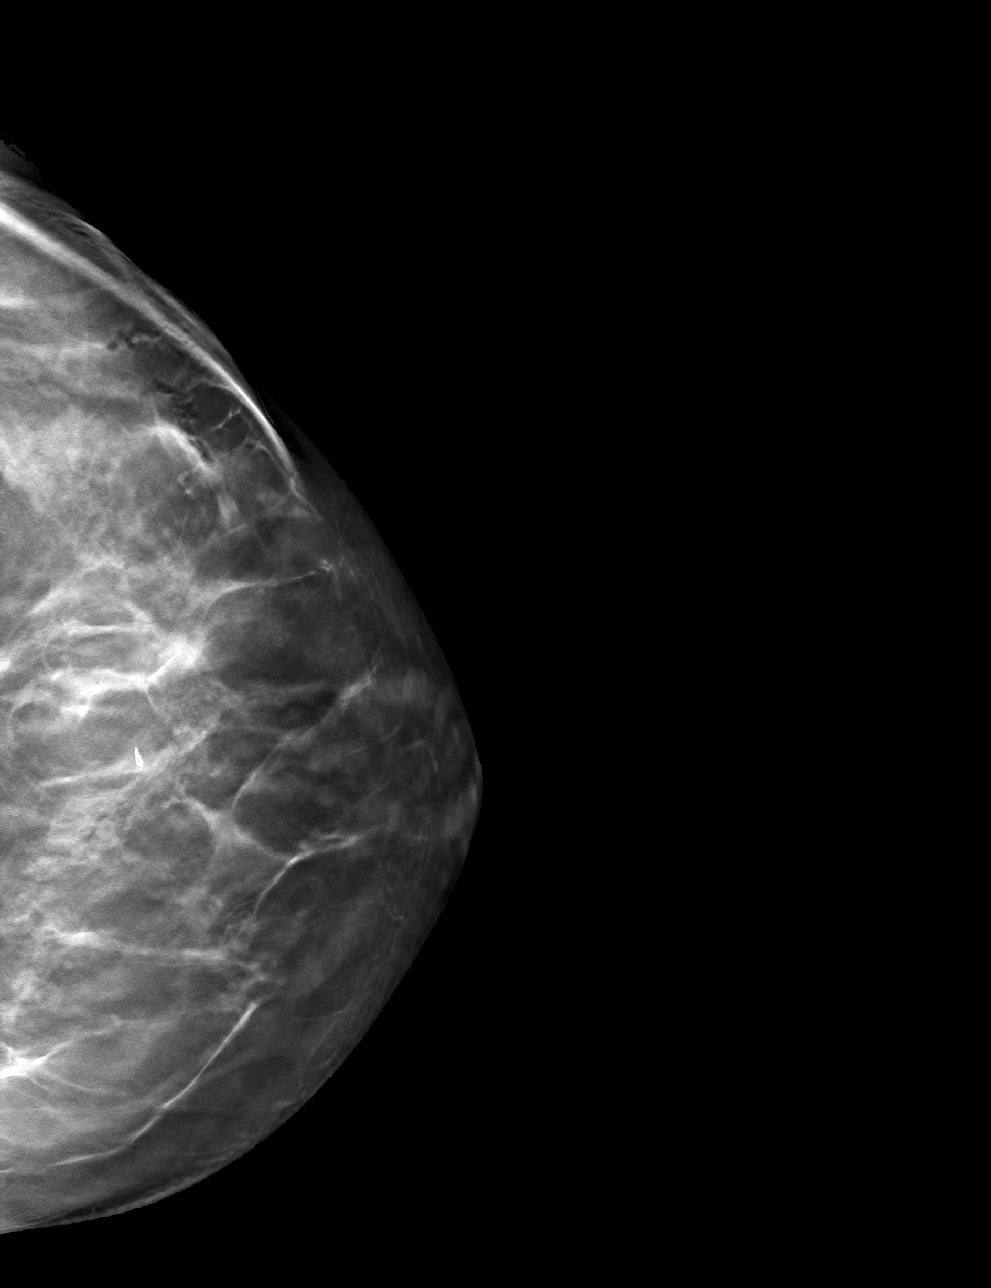

[L ML tomo · tomo slice 46/91.0]
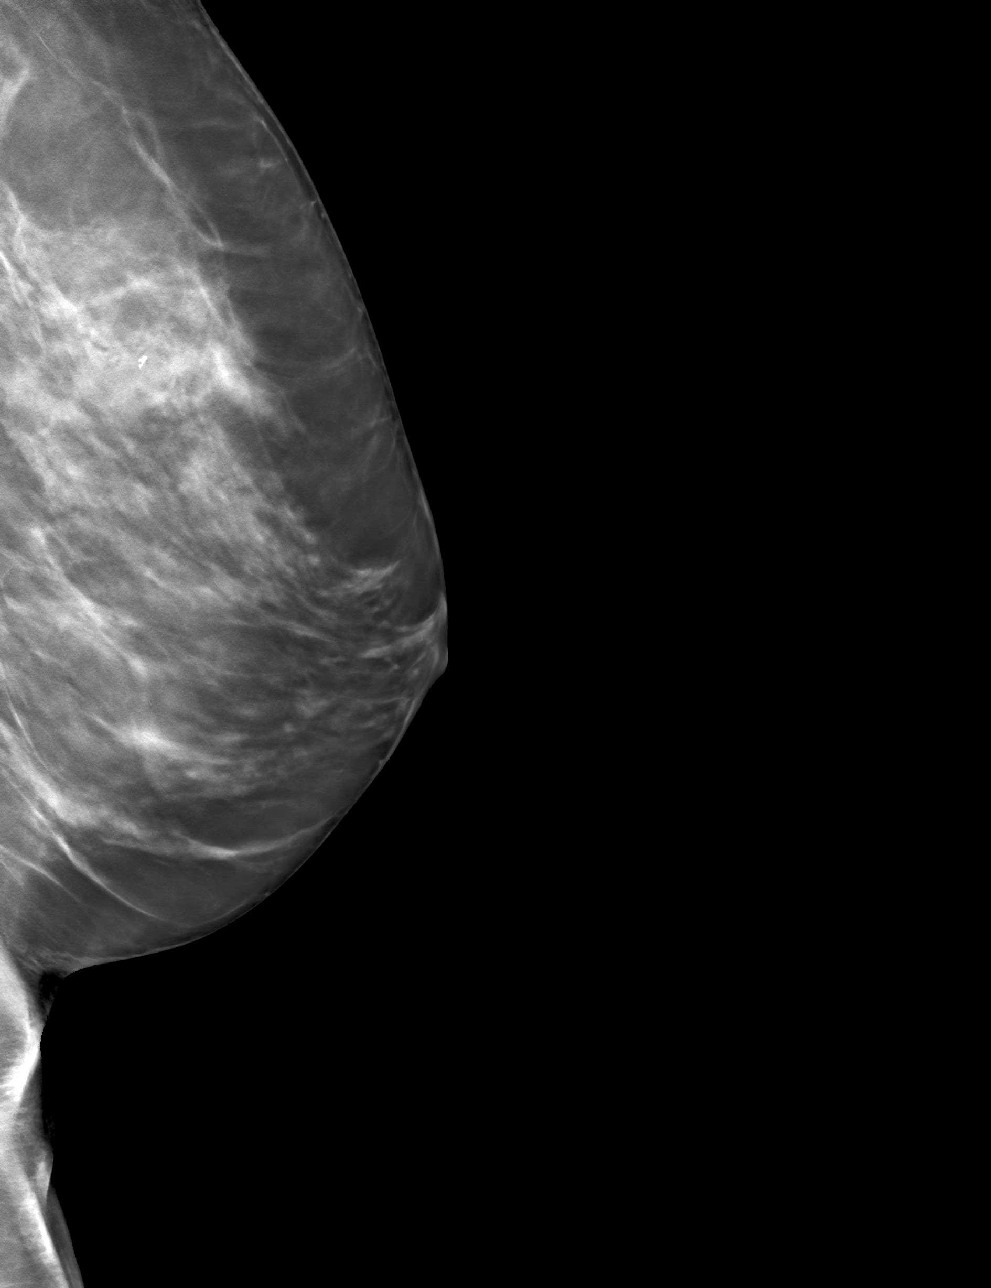

[4 of 12 positions shown; findings below may reference images not displayed]

FINDINGS: 3D Mammographic images were obtained following ultrasound guided
biopsy of a left breast mass. The biopsy marking clip is in the 12
o'clock region of the left breast.
IMPRESSION: Appropriate positioning of the ribbon shaped biopsy marking clip at
the site of biopsy in the 12 o'clock region of the left breast.

Final Assessment: Post Procedure Mammograms for Marker Placement
